# Patient Record
Sex: Female | Born: 1990 | Race: Black or African American | Hispanic: No | Marital: Single | Smoking: Never smoker
Health system: Southern US, Community
[De-identification: ages and names within clinical notes are randomized; demographics above are authoritative.]

## PROBLEM LIST (undated history)

## (undated) DIAGNOSIS — R000 Tachycardia, unspecified: Secondary | ICD-10-CM

## (undated) DIAGNOSIS — R0981 Nasal congestion: Secondary | ICD-10-CM

## (undated) DIAGNOSIS — J329 Chronic sinusitis, unspecified: Secondary | ICD-10-CM

## (undated) DIAGNOSIS — J302 Other seasonal allergic rhinitis: Secondary | ICD-10-CM

## (undated) HISTORY — DX: Tachycardia, unspecified: R00.0

## (undated) HISTORY — DX: Nasal congestion: R09.81

## (undated) HISTORY — DX: Other seasonal allergic rhinitis: J30.2

## (undated) HISTORY — PX: NO PAST SURGERIES: SHX2092

---

## 2011-04-13 ENCOUNTER — Emergency Department (HOSPITAL_COMMUNITY)
Admission: EM | Admit: 2011-04-13 | Discharge: 2011-04-13 | Disposition: A | Discharge status: Home / Self Care | Payer: Self-pay | Attending: Emergency Medicine | Admitting: Emergency Medicine

## 2011-04-13 ENCOUNTER — Emergency Department (HOSPITAL_COMMUNITY): Payer: Self-pay

## 2011-04-13 ENCOUNTER — Encounter (HOSPITAL_COMMUNITY): Payer: Self-pay | Admitting: Emergency Medicine

## 2011-04-13 DIAGNOSIS — R05 Cough: Secondary | ICD-10-CM | POA: Insufficient documentation

## 2011-04-13 DIAGNOSIS — R059 Cough, unspecified: Secondary | ICD-10-CM | POA: Insufficient documentation

## 2011-04-13 DIAGNOSIS — J9801 Acute bronchospasm: Secondary | ICD-10-CM | POA: Insufficient documentation

## 2011-04-13 DIAGNOSIS — J3489 Other specified disorders of nose and nasal sinuses: Secondary | ICD-10-CM | POA: Insufficient documentation

## 2011-04-13 MED ORDER — ALBUTEROL SULFATE (5 MG/ML) 0.5% IN NEBU
5.0000 mg | INHALATION_SOLUTION | Freq: Once | RESPIRATORY_TRACT | Status: AC
  Administered 2011-04-13: 5 mg via RESPIRATORY_TRACT
  Filled 2011-04-13: qty 1

## 2011-04-13 MED ORDER — ALBUTEROL SULFATE HFA 108 (90 BASE) MCG/ACT IN AERS: 2.0000 | INHALATION_SPRAY | RESPIRATORY_TRACT | Status: DC | PRN

## 2011-04-13 MED ORDER — IPRATROPIUM BROMIDE 0.02 % IN SOLN
0.5000 mg | Freq: Once | RESPIRATORY_TRACT | Status: AC
  Administered 2011-04-13: 0.5 mg via RESPIRATORY_TRACT
  Filled 2011-04-13: qty 2.5

## 2011-04-13 MED ORDER — PREDNISONE 20 MG PO TABS
60.0000 mg | ORAL_TABLET | Freq: Once | ORAL | Status: AC
  Administered 2011-04-13: 60 mg via ORAL
  Filled 2011-04-13: qty 3

## 2011-04-13 MED ORDER — GUAIFENESIN ER 1200 MG PO TB12: 1.0000 | ORAL_TABLET | Freq: Two times a day (BID) | ORAL | Status: DC

## 2011-04-13 MED ORDER — PREDNISONE 50 MG PO TABS: 50.0000 mg | ORAL_TABLET | Freq: Every day | ORAL | Status: AC

## 2011-04-13 NOTE — ED Notes (Signed)
Pt. With nasal congestion, cough, and wheezing.  No prior history of asthma.  Not taking any OTC meds.

## 2011-04-13 NOTE — ED Provider Notes (Signed)
History     CSN: [id number]  Arrival date & time 04/13/11  1751   First MD Initiated Contact with Patient 04/13/11 1801      Chief Complaint  Patient presents with  . Nasal Congestion    (Consider location/radiation/quality/duration/timing/severity/associated sxs/prior treatment) HPI Patient presents to the emergency department today with cough.  She states has gotten worse and she went to a baseball game outside today.  She says she does not have a history of asthma, but does have some mild seasonal  allergies.  Patient denies fever, nausea, vomiting, abdominal pain, chest pain, shortness of breath, or back pain.  She does have some nasal congestion with cough and some wheezing. History reviewed. No pertinent past medical history.  History reviewed. No pertinent past surgical history.  No family history on file.  History  Substance Use Topics  . Smoking status: Not on file  . Smokeless tobacco: Not on file  . Alcohol Use: No    OB History    Grav Para Term Preterm Abortions TAB SAB Ect Mult Living                  Review of Systems All pertinent positives and negatives reviewed in the history of present illness  Allergies  Penicillins  Home Medications   Current Outpatient Rx  Name Route Sig Dispense Refill  . BIOTIN 5000 MCG PO CAPS Oral Take 1 capsule by mouth daily.    Marland Kitchen PSEUDOEPHEDRINE-DM-GG-APAP [id number] MG PO PACK Oral Take 1 packet by mouth every 6 (six) hours as needed. Cold    . ZINC SULFATE 220 MG PO CAPS Oral Take 220 mg by mouth daily.      BP 129/71  Pulse 97  Temp(Src) 97.7 F (36.5 C) (Oral)  Resp 20  Ht 5\' 2"  (1.575 m)  Wt 150 lb (68.04 kg)  BMI 27.44 kg/m2  SpO2 100%  LMP 03/30/2011  Physical Exam  Constitutional: She is oriented to person, place, and time. She appears well-developed and well-nourished. No distress.  HENT:  Head: Normocephalic and atraumatic.  Mouth/Throat: Oropharynx is clear and moist. No oropharyngeal  exudate.  Eyes: Conjunctivae are normal. Pupils are equal, round, and reactive to light.  Neck: Normal range of motion. Neck supple.  Cardiovascular: Normal rate, regular rhythm and normal heart sounds.  Exam reveals no gallop and no friction rub.   No murmur heard. Pulmonary/Chest: Effort normal and breath sounds normal. No respiratory distress. She has no wheezes. She has no rales.  Neurological: She is alert and oriented to person, place, and time.  Skin: Skin is warm and dry. No rash noted.    ED Course  Procedures (including critical care time)   Patient is given a nebulized breathing treatment here in the emergency department along with some steroid.  She states feeling better following this treatment.  This is most likely suffering from bronchospasm from environmental allergens.  She will be referred back to her primary care doctor as needed.  Told to return here for any worsening in her condition. MDM  MDM Reviewed: nursing note and vitals Interpretation: x-ray           Carlyle Dolly, PA-C 04/13/11 2018

## 2011-04-13 NOTE — Discharge Instructions (Signed)
And return here as needed for any worsening in her condition.  Increase her fluid intake.  Your x-rays did not show any signs of pneumonia.  It did show that she has some inflammation in her airways, potentially.

## 2011-04-13 NOTE — ED Provider Notes (Signed)
Medical screening examination/treatment/procedure(s) were performed by non-physician practitioner and as supervising physician I was immediately available for consultation/collaboration.  Doug Sou, MD 04/13/11 2317

## 2014-06-03 ENCOUNTER — Encounter (HOSPITAL_COMMUNITY): Payer: Self-pay | Admitting: Emergency Medicine

## 2014-06-03 ENCOUNTER — Emergency Department (HOSPITAL_COMMUNITY)
Admission: EM | Admit: 2014-06-03 | Discharge: 2014-06-03 | Disposition: A | Discharge status: Home / Self Care | Payer: BLUE CROSS/BLUE SHIELD | Attending: Emergency Medicine | Admitting: Emergency Medicine

## 2014-06-03 DIAGNOSIS — B349 Viral infection, unspecified: Secondary | ICD-10-CM | POA: Insufficient documentation

## 2014-06-03 DIAGNOSIS — R51 Headache: Secondary | ICD-10-CM | POA: Diagnosis present

## 2014-06-03 DIAGNOSIS — R002 Palpitations: Secondary | ICD-10-CM | POA: Insufficient documentation

## 2014-06-03 DIAGNOSIS — Z79899 Other long term (current) drug therapy: Secondary | ICD-10-CM | POA: Insufficient documentation

## 2014-06-03 DIAGNOSIS — M791 Myalgia: Secondary | ICD-10-CM | POA: Insufficient documentation

## 2014-06-03 DIAGNOSIS — Z880 Allergy status to penicillin: Secondary | ICD-10-CM | POA: Insufficient documentation

## 2014-06-03 DIAGNOSIS — R630 Anorexia: Secondary | ICD-10-CM | POA: Diagnosis not present

## 2014-06-03 DIAGNOSIS — R000 Tachycardia, unspecified: Secondary | ICD-10-CM | POA: Diagnosis not present

## 2014-06-03 DIAGNOSIS — R42 Dizziness and giddiness: Secondary | ICD-10-CM | POA: Diagnosis not present

## 2014-06-03 HISTORY — DX: Chronic sinusitis, unspecified: J32.9

## 2014-06-03 LAB — CBC WITH DIFFERENTIAL/PLATELET
BASOS ABS: 0 10*3/uL (ref 0.0–0.1)
BASOS PCT: 0 % (ref 0–1)
EOS ABS: 0.5 10*3/uL (ref 0.0–0.7)
EOS PCT: 6 % — AB (ref 0–5)
HEMATOCRIT: 36.3 % (ref 36.0–46.0)
Hemoglobin: 11.6 g/dL — ABNORMAL LOW (ref 12.0–15.0)
LYMPHS ABS: 1.6 10*3/uL (ref 0.7–4.0)
LYMPHS PCT: 18 % (ref 12–46)
MCH: 28.3 pg (ref 26.0–34.0)
MCHC: 32 g/dL (ref 30.0–36.0)
MCV: 88.5 fL (ref 78.0–100.0)
MONOS PCT: 7 % (ref 3–12)
Monocytes Absolute: 0.7 10*3/uL (ref 0.1–1.0)
Neutro Abs: 6 10*3/uL (ref 1.7–7.7)
Neutrophils Relative %: 69 % (ref 43–77)
Platelets: 273 10*3/uL (ref 150–400)
RBC: 4.1 MIL/uL (ref 3.87–5.11)
RDW: 13.6 % (ref 11.5–15.5)
WBC: 8.8 10*3/uL (ref 4.0–10.5)

## 2014-06-03 LAB — COMPREHENSIVE METABOLIC PANEL
ALBUMIN: 4.3 g/dL (ref 3.5–5.0)
ALT: 26 U/L (ref 14–54)
ANION GAP: 9 (ref 5–15)
AST: 24 U/L (ref 15–41)
Alkaline Phosphatase: 85 U/L (ref 38–126)
BUN: 9 mg/dL (ref 6–20)
CHLORIDE: 104 mmol/L (ref 101–111)
CO2: 22 mmol/L (ref 22–32)
CREATININE: 0.81 mg/dL (ref 0.44–1.00)
Calcium: 9.8 mg/dL (ref 8.9–10.3)
GFR calc Af Amer: >60 mL/min (ref >60)
GLUCOSE: 104 mg/dL — AB (ref 65–99)
POTASSIUM: 3.7 mmol/L (ref 3.5–5.1)
SODIUM: 135 mmol/L (ref 135–145)
TOTAL PROTEIN: 7.7 g/dL (ref 6.5–8.1)
Total Bilirubin: 0.7 mg/dL (ref 0.3–1.2)

## 2014-06-03 LAB — URINALYSIS, ROUTINE W REFLEX MICROSCOPIC
Bilirubin Urine: NEGATIVE
GLUCOSE, UA: NEGATIVE mg/dL
HGB URINE DIPSTICK: NEGATIVE
Ketones, ur: NEGATIVE mg/dL
LEUKOCYTES UA: NEGATIVE
NITRITE: NEGATIVE
PH: 5.5 (ref 5.0–8.0)
Protein, ur: NEGATIVE mg/dL
Specific Gravity, Urine: 1.02 (ref 1.005–1.030)
UROBILINOGEN UA: 1 mg/dL (ref 0.0–1.0)

## 2014-06-03 LAB — RAPID STREP SCREEN (MED CTR MEBANE ONLY): Streptococcus, Group A Screen (Direct): NEGATIVE

## 2014-06-03 LAB — I-STAT CG4 LACTIC ACID, ED: LACTIC ACID, VENOUS: 1.07 mmol/L (ref 0.5–2.0)

## 2014-06-03 NOTE — ED Notes (Signed)
C/o generalized body aches, fever, chills, nasal congestion, and headache since Friday morning.  Last took Tylenol 1000mg  at 1am.

## 2014-06-03 NOTE — ED Notes (Addendum)
Pt refusing to have IV started, waiting for MD to assess her. HR on the 140's Nanavati to be notified.

## 2014-06-03 NOTE — Discharge Instructions (Signed)
We saw you in the ER for the malaise, weakness, aches, upper respiratory infection like symptoms. We think what you have is a viral syndrome - the treatment for which is symptomatic relief only, and your body will fight the infection off in a few days.  See your primary care doctor in 1 week if the symptoms dont improve.  AS DISCUSSED - your heart rate is fast, you have some right sided strain evidence and we discussed pulmonary embolism as something to think about. You have opted to have your doctor look into that and return to the ER if your symptoms get worse - please read the information on PE below.   Viral Infections A viral infection can be caused by different types of viruses.Most viral infections are not serious and resolve on their own. However, some infections may cause severe symptoms and may lead to further complications. SYMPTOMS Viruses can frequently cause:  Minor sore throat.  Aches and pains.  Headaches.  Runny nose.  Different types of rashes.  Watery eyes.  Tiredness.  Cough.  Loss of appetite.  Gastrointestinal infections, resulting in nausea, vomiting, and diarrhea. These symptoms do not respond to antibiotics because the infection is not caused by bacteria. However, you might catch a bacterial infection following the viral infection. This is sometimes called a "superinfection." Symptoms of such a bacterial infection may include:  Worsening sore throat with pus and difficulty swallowing.  Swollen neck glands.  Chills and a high or persistent fever.  Severe headache.  Tenderness over the sinuses.  Persistent overall ill feeling (malaise), muscle aches, and tiredness (fatigue).  Persistent cough.  Yellow, green, or brown mucus production with coughing. HOME CARE INSTRUCTIONS   Only take over-the-counter or prescription medicines for pain, discomfort, diarrhea, or fever as directed by your caregiver.  Drink enough water and fluids to keep your  urine clear or pale yellow. Sports drinks can provide valuable electrolytes, sugars, and hydration.  Get plenty of rest and maintain proper nutrition. Soups and broths with crackers or rice are fine. SEEK IMMEDIATE MEDICAL CARE IF:   You have severe headaches, shortness of breath, chest pain, neck pain, or an unusual rash.  You have uncontrolled vomiting, diarrhea, or you are unable to keep down fluids.  You or your child has an oral temperature above 102 F (38.9 C), not controlled by medicine.  Your baby is older than 3 months with a rectal temperature of 102 F (38.9 C) or higher.  Your baby is 55 months old or younger with a rectal temperature of 100.4 F (38 C) or higher. MAKE SURE YOU:   Understand these instructions.  Will watch your condition.  Will get help right away if you are not doing well or get worse. Document Released: 10/02/2004 Document Revised: 03/17/2011 Document Reviewed: 04/29/2010 [facility] Patient Information 2015 [place], [place]. This information is not intended to replace advice given to you by your health care provider. Make sure you discuss any questions you have with your health care provider. Pulmonary Embolism A pulmonary (lung) embolism (PE) is a blood clot that has traveled to the lung and results in a blockage of blood flow in the affected lung. Most clots come from deep veins in the legs or pelvis. PE is a dangerous and potentially life-threatening condition that can be treated if identified. CAUSES Blood clots form in a vein for different reasons. Usually several things cause blood clots. They include:  The flow of blood slows down.  The inside of the  vein is damaged in some way.  The person has a condition that makes the blood clot more easily. RISK FACTORS Some people are more likely than others to develop PE. Risk factors include:   Smoking.  Being overweight (obese).  Sitting or lying still for a long time. This includes long-distance  travel, paralysis, or recovery from an illness or surgery. Other factors that increase risk are:   Older age, especially over 23 years of age.  Having a family history of blood clots or if you have already had a blood clot.  Having major or lengthy surgery. This is especially true for surgery on the hip, knee, or belly (abdomen). Hip surgery is particularly high risk.  Having a long, thin tube (catheter) placed inside a vein during a medical procedure.  Breaking a hip or leg.  Having cancer or cancer treatment.  Medicines containing the female hormone estrogen. This includes birth control pills and hormone replacement therapy.  Other circulation or heart problems.  Pregnancy and childbirth.  Hormone changes make the blood clot more easily during pregnancy.  The fetus puts pressure on the veins of the pelvis.  There is a risk of injury to veins during delivery or a caesarean delivery. The risk is highest just after childbirth.  PREVENTION   Exercise the legs regularly. Take a brisk 30 minute walk every day.  Maintain a weight that is appropriate for your height.  Avoid sitting or lying in bed for long periods of time without moving your legs.  Women, particularly those over the age of 35 years, should consider the risks and benefits of taking estrogen medicines, including birth control pills.  Do not smoke, especially if you take estrogen medicines.  Long-distance travel can increase your risk. You should exercise your legs by walking or pumping the muscles every hour.  Many of the risk factors above relate to situations that exist with hospitalization, either for illness, injury, or elective surgery. Prevention may include medical and nonmedical measures.   Your health care provider will assess you for the need for venous thromboembolism prevention when you are admitted to the hospital. If you are having surgery, your surgeon will assess you the day of or day after  surgery.  SYMPTOMS  The symptoms of a PE usually start suddenly and include:  Shortness of breath.  Coughing.  Coughing up blood or blood-tinged mucus.  Chest pain. Pain is often worse with deep breaths.  Rapid heartbeat. DIAGNOSIS  If a PE is suspected, your health care provider will take a medical history and perform a physical exam. Other tests that may be required include:  Blood tests, such as studies of the clotting properties of your blood.  Imaging tests, such as ultrasound, CT, MRI, and other tests to see if you have clots in your legs or lungs.  An electrocardiogram. This can look for heart strain from blood clots in the lungs. TREATMENT   The most common treatment for a PE is blood thinning (anticoagulant) medicine, which reduces the blood's tendency to clot. Anticoagulants can stop new blood clots from forming and old clots from growing. They cannot dissolve existing clots. Your body does this by itself over time. Anticoagulants can be given by mouth, through an intravenous (IV) tube, or by injection. Your health care provider will determine the best program for you.  Less commonly, clot-dissolving medicines (thrombolytics) are used to dissolve a PE. They carry a high risk of bleeding, so they are used mainly in severe cases.  Very rarely, a blood clot in the leg needs to be removed surgically.  If you are unable to take anticoagulants, your health care provider may arrange for you to have a filter placed in a main vein in your abdomen. This filter prevents clots from traveling to your lungs. HOME CARE INSTRUCTIONS   Take all medicines as directed by your health care provider.  Learn as much as you can about DVT.  Wear a medical alert bracelet or carry a medical alert card.  Ask your health care provider how soon you can go back to normal activities. It is important to stay active to prevent blood clots. If you are on anticoagulant medicine, avoid contact  sports.  It is very important to exercise. This is especially important while traveling, sitting, or standing for long periods of time. Exercise your legs by walking or by tightening and relaxing your leg muscles regularly. Take frequent walks.  You may need to wear compression stockings. These are tight elastic stockings that apply pressure to the lower legs. This pressure can help keep the blood in the legs from clotting. Taking Warfarin Warfarin is a daily medicine that is taken by mouth. Your health care provider will advise you on the length of treatment (usually 3-6 months, sometimes lifelong). If you take warfarin:  Understand how to take warfarin and foods that can affect how warfarin works in Public relations account executive.  Too much and too little warfarin are both dangerous. Too much warfarin increases the risk of bleeding. Too little warfarin continues to allow the risk for blood clots. Warfarin and Regular Blood Testing While taking warfarin, you will need to have regular blood tests to measure your blood clotting time. These blood tests usually include both the prothrombin time (PT) and international normalized ratio (INR) tests. The PT and INR results allow your health care provider to adjust your dose of warfarin. It is very important that you have your PT and INR tested as often as directed by your health care provider.  Warfarin and Your Diet Avoid major changes in your diet, or notify your health care provider before changing your diet. Arrange a visit with a registered dietitian to answer your questions. Many foods, especially foods high in vitamin K, can interfere with warfarin and affect the PT and INR results. You should eat a consistent amount of foods high in vitamin K. Foods high in vitamin K include:   Spinach, kale, broccoli, cabbage, collard and turnip greens, Brussels sprouts, peas, cauliflower, seaweed, and parsley.  Beef and pork liver.  Green tea.  Soybean oil. Warfarin with Other  Medicines Many medicines can interfere with warfarin and affect the PT and INR results. You must:  Tell your health care provider about any and all medicines, vitamins, and supplements you take, including aspirin and other over-the-counter anti-inflammatory medicines. Be especially cautious with aspirin and anti-inflammatory medicines. Ask your health care provider before taking these.  Do not take or discontinue any prescribed or over-the-counter medicine except on the advice of your health care provider or pharmacist. Warfarin Side Effects Warfarin can have side effects, such as easy bruising and difficulty stopping bleeding. Ask your health care provider or pharmacist about other side effects of warfarin. You will need to:  Hold pressure over cuts for longer than usual.  Notify your dentist and other health care providers that you are taking warfarin before you undergo any procedures where bleeding may occur. Warfarin with Alcohol and Tobacco   Drinking alcohol frequently can increase  the effect of warfarin, leading to excess bleeding. It is best to avoid alcoholic drinks or consume only very small amounts while taking warfarin. Notify your health care provider if you change your alcohol intake.  Do not use any tobacco products including cigarettes, chewing tobacco, or electronic cigarettes. If you smoke, quit. Ask your health care provider for help with quitting smoking. Alternative Medicines to Warfarin: Factor Xa Inhibitor Medicines  These blood thinning medicines are taken by mouth, usually for several weeks or longer. It is important to take the medicine every single day, at the same time each day.  There are no regular blood tests required when using these medicines.  There are fewer food and drug interactions than with warfarin.  The side effects of this class of medicine is similar to that of warfarin, including excessive bruising or bleeding. Ask your health care provider or  pharmacist about other potential side effects. SEEK MEDICAL CARE IF:   You notice a rapid heartbeat.  You feel weaker or more tired than usual.  You feel faint.  You notice increased bruising.  Your symptoms are not getting better in the time expected.  You are having side effects of medicine. SEEK IMMEDIATE MEDICAL CARE IF:   You have chest pain.  You have trouble breathing.  You have new or increased swelling or pain in one leg.  You cough up blood.  You notice blood in vomit, in a bowel movement, or in urine.  You have a fever. Symptoms of PE may represent a serious problem that is an emergency. Do not wait to see if the symptoms will go away. Get medical help right away. Call your local emergency services (911 in the [place]). Do not drive yourself to the hospital. Document Released: 12/21/1999 Document Revised: 05/09/2013 Document Reviewed: 01/03/2013 [facility] Patient Information 2015 [place], [place]. This information is not intended to replace advice given to you by your health care provider. Make sure you discuss any questions you have with your health care provider.

## 2014-06-03 NOTE — ED Provider Notes (Signed)
CSN: [id number]     Arrival date & time 06/03/14  0148 History  This chart was scribed for Derwood KaplanAnkit Crystallynn Noorani, MD by Freida Busmaniana Omoyeni, ED Scribe. This patient was seen in room D35C/D35C and the patient's care was started 2:53 AM.     Chief Complaint  Patient presents with  . Headache  . Generalized Body Aches    The history is provided by the patient. No language interpreter was used.    HPI Comments:  Sherri Hodges is a 24 y.o. female who presents to the Emergency Department complaining of generalized body aches and fever for 1 day. She reports associated dizziness, rhinorrhea, sore throat, decreased PO intake, and tachycardia with a HR of 120 bpm,which she notes is normal for her.  She has taken tylenol for her fever; her last dose was ~0100 this am. Pt reports a h/o sinus infection; notes symptoms today are dissimilar.She denies diarrhea, CP,  SOB,cough and rash. Pt takes minastrin birth control pills, which she has been taking for about 3 years. 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  She also denies h/o blood clots, recent surgery/long travel and  family h/o blood clots     Past Medical History  Diagnosis Date  . Sinus infection    History reviewed. No pertinent past surgical history. No family history on file. History  Substance Use Topics  . Smoking status: Never Smoker   . Smokeless tobacco: Not on file  . Alcohol Use: Yes   OB History    No data available     Review of Systems  Constitutional: Positive for fever and appetite change.  HENT: Positive for rhinorrhea and sore throat.   Respiratory: Negative for cough and shortness of breath.   Cardiovascular: Positive for palpitations. Negative for chest pain.  Musculoskeletal: Positive for myalgias (Generalized ).  Skin: Negative for rash.  Neurological: Positive for dizziness.  All other systems reviewed and are  negative.     Allergies  Penicillins  Home Medications   Prior to Admission medications   Medication Sig Start Date End Date Taking? Authorizing Provider  albuterol (PROVENTIL HFA;VENTOLIN HFA) 108 (90 BASE) MCG/ACT inhaler Inhale 2 puffs into the lungs every 4 (four) hours as needed for wheezing. 04/13/11 06/03/14 Yes Christopher Lawyer, PA-C  montelukast (SINGULAIR) 10 MG tablet Take 10 mg by mouth daily as needed (allergies).   Yes Historical Provider, MD  Norethin Ace-Eth Estrad-FE 1-20 MG-MCG(24) CHEW Chew 1 tablet by mouth daily.   Yes Historical Provider, MD   BP 116/61 mmHg  Pulse 99  Temp(Src) 99.1 F (37.3 C) (Oral)  Resp 17  SpO2 100%  LMP 06/01/2014 Physical Exam  Constitutional: She is oriented to person, place, and time. She appears well-developed and well-nourished. No distress.  HENT:  Head: Normocephalic and atraumatic.  No tonsillar enlargement, exudates or erythema Mild submandibular cervical lymphadenopathy  Eyes: Conjunctivae are normal.  Neck:  Thyroid gland appears to be normal with no abnormal nodules   Cardiovascular: Regular rhythm.  Tachycardia present.   No murmur heard. Pulmonary/Chest: Effort normal and breath sounds normal. No respiratory distress.  Abdominal: She exhibits no distension.  Musculoskeletal: She exhibits no edema.  No BLE Pitting edema  Neurological: She is alert and oriented to person, place, and time.  Skin: Skin is warm and dry.  Psychiatric: She has a normal mood and affect.  Nursing note and vitals reviewed.  ED Course  Procedures  DIAGNOSTIC STUDIES:  Oxygen Saturation is 97% on RA, normal by my interpretation.    COORDINATION OF CARE:  3:01 AM Will order d-dimer to r/o blood clots. Advised pt on return precautions. Discussed treatment plan with pt at bedside and pt agreed to plan.  Labs Review Labs Reviewed  CBC WITH DIFFERENTIAL/PLATELET - Abnormal; Notable for the following:    Hemoglobin 11.6 (*)     Eosinophils Relative 6 (*)    All other components within normal limits  COMPREHENSIVE METABOLIC PANEL - Abnormal; Notable for the following:    Glucose, Bld 104 (*)    All other components within normal limits  RAPID STREP SCREEN (NOT AT [facility])  CULTURE, GROUP A STREP  URINALYSIS, ROUTINE W REFLEX MICROSCOPIC (NOT AT [facility])  I-STAT CG4 LACTIC ACID, ED  POC URINE PREG, ED    Imaging Review No results found.   EKG Interpretation   Date/Time:  Saturday Jun 03 2014 01:58:16 EDT Ventricular Rate:  141 PR Interval:  130 QRS Duration: 80 QT Interval:  278 QTC Calculation: 425 R Axis:   90 Text Interpretation:  Sinus tachycardia Rightward axis Borderline ECG  s1q3t3 pattern seen No old tracing to compare Confirmed by Rhunette Croft, MD,  Janey Genta [id number]) on 06/03/2014 3:04:37 AM      MDM   Final diagnoses:  Viral syndrome  Tachycardia    I personally performed the services described in this documentation, which was scribed in my presence. The recorded information has been reviewed and is accurate.   Pt comes in with flu like symptoms. She has tachycardia, fevers. She refuses iv hydration and wants to get oral hydration instead -which i am ok with given normal lactate and labs.  Pt's HR as high as 140, she has s1q3t3 pattern and is on birth control. Discussed TSH and Dimer workup - but pt states that her HR is always this high. Explained to her the PE risk given the birth control use. 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 I have no goiter on exam - advised her to see her pcp in 1 week - especially given the PE and thyroid concerns - and she is ok with that. Strict return precautions for PE like sx discussed.  Derwood Kaplan, MD 06/03/14 [phone]

## 2014-06-06 LAB — CULTURE, GROUP A STREP: Strep A Culture: NEGATIVE

## 2015-03-26 ENCOUNTER — Telehealth: Payer: Self-pay | Admitting: Cardiovascular Disease

## 2015-03-26 NOTE — Telephone Encounter (Signed)
Received records from [facility] for appointment on 04/09/15 with Dr Duke Salviaandolph.  [facility] Dr Leonides Sakeandolph's schedule on 04/09/15. lp

## 2015-04-09 ENCOUNTER — Ambulatory Visit: Payer: BLUE CROSS/BLUE SHIELD | Admitting: Cardiovascular Disease

## 2015-05-31 ENCOUNTER — Encounter: Payer: Self-pay | Admitting: Cardiovascular Disease

## 2015-05-31 ENCOUNTER — Ambulatory Visit (INDEPENDENT_AMBULATORY_CARE_PROVIDER_SITE_OTHER): Payer: BC Managed Care – PPO | Admitting: Cardiovascular Disease

## 2015-05-31 VITALS — BP 126/68 | HR 118 | Ht 62.0 in | Wt 159.0 lb

## 2015-05-31 DIAGNOSIS — R000 Tachycardia, unspecified: Secondary | ICD-10-CM

## 2015-05-31 DIAGNOSIS — R0981 Nasal congestion: Secondary | ICD-10-CM | POA: Insufficient documentation

## 2015-05-31 DIAGNOSIS — R0602 Shortness of breath: Secondary | ICD-10-CM

## 2015-05-31 DIAGNOSIS — J302 Other seasonal allergic rhinitis: Secondary | ICD-10-CM | POA: Insufficient documentation

## 2015-05-31 HISTORY — DX: Tachycardia, unspecified: R00.0

## 2015-05-31 MED ORDER — METOPROLOL SUCCINATE ER 25 MG PO TB24: ORAL_TABLET | ORAL | Status: AC

## 2015-05-31 NOTE — Patient Instructions (Signed)
Medication Instructions:  START METOPROLOL SUCC 25 MG 1/2 TABLET DAILY  Labwork: NONE  Testing/Procedures: Your physician has requested that you have an echocardiogram. Echocardiography is a painless test that uses sound waves to create images of your heart. It provides your doctor with information about the size and shape of your heart and how well your heart's chambers and valves are working. This procedure takes approximately one hour. There are no restrictions for this procedure. [facility] [address]  A chest x-ray takes a picture of the organs and structures inside the chest, including the heart, lungs, and blood vessels. This test can show several things, including, whether the heart is enlarges; whether fluid is building up in the lungs; and whether pacemaker / defibrillator leads are still in place. [facility]  VQ SCAN SOON  Follow-Up: Your physician recommends that you schedule a follow-up appointment in: 1 MONTH OV   If you need a refill on your cardiac medications before your next appointment, please call your pharmacy.  Ventilation-Perfusion Scan A ventilation-perfusion scan is a scan to look at the airflow (ventilation) and blood flow (perfusion) in your lungs. It is most often used to look for blood clots that may have traveled to your lungs. During this scan, radioactive compounds are injected into your body or are breathed in (inhale). These radioactive compounds are detected by a special camera during the scan, are given at very low doses, are not harmful to you, and last in your body for a very short time.  LET [facility] KNOW ABOUT:  Any allergies you have.  All medicines you are taking, including vitamins, herbs, eye drops, creams, and over-the-counter medicines.  Any blood disorders you have.  Previous surgeries you have had.  Medical conditions you have.  Possibility of pregnancy, if  this applies.  Breastfeeding, if this applies. RISKS AND COMPLICATIONS Generally, this is a safe procedure. However, as with any procedure, complications can occur. A possible complication includes having an allergic reaction to the radioactive compounds.  BEFORE THE PROCEDURE  Do not smoke before your test.  Take medicine as directed by your health care provider. PROCEDURE  A small needle will be placed in a vein in your arm or hand. This needle will stay in place for the entire exam.  A small amount of very short-acting radioactive material will be injected.  Your lungs will then be scanned using a special camera. This camera will record the images.  You will be asked to inhale a second radioactive compound. After this, the lungs are scanned again. AFTER THE PROCEDURE  You may go home unless your health care provider instructs you differently.  You may continue with normal activities and diet as instructed by your health care provider.   This information is not intended to replace advice given to you by your health care provider. Make sure you discuss any questions you have with your health care provider.   Document Released: 12/21/1999 Document Revised: 01/13/2014 Document Reviewed: 07/08/2012 Elsevier Interactive Patient Education [employer]2016 Elsevier Inc.

## 2015-05-31 NOTE — Progress Notes (Signed)
Cardiology Office Note   Date:  05/31/2015   ID:  Sherri Hodges, DOB Aug 21, 1990, MRN [id number]  PCP:  No primary care provider on file.  Cardiologist:   Sherri Siiffany Gleason, MD   Chief Complaint  Patient presents with  . New Evaluation    TACHYCARDIA  . Chest Pain     History of Present Illness: Sherri Hodges is a 25 y.o. female who presents for an evaluation of sinus tachycardia. Ms. Sherri Hodges reports elevated resting heart rates for over a year now. 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 The lowest she has seen it is in the upper 80s. She denies palpitations, lightheadedness, or dizziness. However she does no exercise intolerance and has noticed that her heart rate increases significantly when she exercises. If his of this she has not been exercising recently. She notes occasional episodes of chest pain that lasts for 30 seconds at a time. There is no associated lightheadedness or dizziness. It does not occur with exertion. She saw her PCP who checks labs that were reportedly normal and referred her to cardiology.  Ms. Sherri Hodges drinks 1 caffeinated beverage daily at the most. She has only been doing this for the last couple months, as her shift ends at midnight and she drinks coffee prior to her right home. The tachycardia preceded her caffeine intake. She does not use over-the-counter decongestants or stimulant medications.  She also denies illicit drug use.  She rarely uses her albuterol inhaler. She has been stressed lately, as she is working as a Radiation protection practitionerparamedic, in school for nursing, trying to buy a house. However, her tachycardia preceded these stressors. She does not feel particularly anxious.  Ms. Sherri Hodges has not noted any lower extremity edema, orthopnea or PND.   Past Medical History  Diagnosis Date  . Sinus infection   . Seasonal allergies   . Sinus congestion   . Inappropriate sinus tachycardia (HCC) 05/31/2015    History reviewed. No  pertinent past surgical history.   Current Outpatient Prescriptions  Medication Sig Dispense Refill  . montelukast (SINGULAIR) 10 MG tablet Take 10 mg by mouth daily as needed (allergies).    . Norethin Ace-Eth Estrad-FE 1-20 MG-MCG(24) CHEW Chew 1 tablet by mouth daily.    Marland Kitchen. albuterol (PROVENTIL HFA;VENTOLIN HFA) 108 (90 BASE) MCG/ACT inhaler Inhale 2 puffs into the lungs every 4 (four) hours as needed for wheezing. 3.7 g 0  . metoprolol succinate (TOPROL XL) 25 MG 24 hr tablet 1/2 TABLET BY MOUTH DAILY 15 tablet 5   No current facility-administered medications for this visit.    Allergies:   Penicillins and Sulfa antibiotics    Social History:  The patient  reports that she has never smoked. She does not have any smokeless tobacco history on file. She reports that she drinks alcohol. She reports that she does not use illicit drugs.   Family History:  The patient's family history includes Diabetes in her maternal grandfather; Hypertension in her maternal grandfather and paternal grandmother.    ROS:  Please see the history of present illness.   Otherwise, review of systems are positive for none.   All other systems are reviewed and negative.    PHYSICAL EXAM: VS:  BP 126/68 mmHg  Pulse 118  Ht 5\' 2"  (1.575 m)  Wt 72.122 kg (159 lb)  BMI 29.07 kg/m2 , BMI Body mass index is 29.07 kg/(m^2). GENERAL:  Well appearing HEENT:  Pupils equal  round and reactive, fundi not visualized, oral mucosa unremarkable NECK:  No jugular venous distention, waveform within normal limits, carotid upstroke brisk and symmetric, no bruits, no thyromegaly LYMPHATICS:  No cervical adenopathy LUNGS:  Clear to auscultation bilaterally HEART:  RRR.  PMI not displaced or sustained,S1 and S2 within normal limits, no S3, no S4, no clicks, no rubs, II/VI systolic murmur at the left upper sternal border.  ABD:  Flat, positive bowel sounds normal in frequency in pitch, no bruits, no rebound, no guarding, no midline  pulsatile mass, no hepatomegaly, no splenomegaly EXT:  2 plus pulses throughout, no edema, no cyanosis no clubbing SKIN:  No rashes no nodules NEURO:  Cranial nerves II through XII grossly intact, motor grossly intact throughout PSYCH:  Cognitively intact, oriented to person place and time   EKG:  EKG is ordered today. The ekg ordered today demonstrates sinus tachycardia. Rate 118 bpm.  Recent Labs: 06/03/2014: ALT 26; BUN 9; Creatinine, Ser 0.81; Hemoglobin 11.6*; Platelets 273; Potassium 3.7; Sodium 135    Lipid Panel No results found for: CHOL, TRIG, HDL, CHOLHDL, VLDL, LDLCALC, LDLDIRECT    Wt Readings from Last 3 Encounters:  05/31/15 72.122 kg (159 lb)  04/13/11 68.04 kg (150 lb) (81 %*, Z = 0.86)   * Growth percentiles are based on CDC 2-20 Years data.      ASSESSMENT AND PLAN:  # Sinus tachycardia:  # murmur: [name] resting heart rate has been consistently in the 110s.  There are no clear triggers for her sinus tachycardia. She has no evidence of infection, pain, or medications that should be causing this. She does have albuterol but uses it very rarely. She had labs with her primary care provider that reportedly showed normal thyroid function and blood counts. We will obtain these records from her provider.  We will also obtain a VQ scan to assess for chronic pulmonary embolism. Additionally, we will obtain an echo to evaluate her murmur and to ensure that she does not have any heart failure from prolonged tachycardia. We will try metoprolol succinate 12.5 mg daily. It is quite possible that this will cause dizziness as her blood pressure is normal at this time. His occurs, we will consider ivabradine.  # Atypical chest pain: Not consistent with ischemia. We will look for her coronary artery origins on echo.   Current medicines are reviewed at length with the patient today.  The patient does not have concerns regarding medicines.  The following changes have been made:   Start metoprolol succinate 12.5 mg daily  Labs/ tests ordered today include:   Orders Placed This Encounter  Procedures  . NM Pulmonary Perf and Vent  . DG Chest 2 View  . EKG 12-Lead  . ECHOCARDIOGRAM COMPLETE     Disposition:   FU with Keshia Weare C. Duke Salvia, MD, [facility] in 1 month.     This note was written with the assistance of speech recognition software.  Please excuse any transcriptional errors.  Signed, Maudine Kluesner C. Duke Salvia, MD, [facility]  05/31/2015 9:01 AM    [facility]

## 2015-06-08 ENCOUNTER — Ambulatory Visit (HOSPITAL_COMMUNITY)
Admission: RE | Admit: 2015-06-08 | Discharge: 2015-06-08 | Disposition: A | Discharge status: Home / Self Care | Payer: Managed Care, Other (non HMO) | Source: Ambulatory Visit | Attending: Cardiovascular Disease | Admitting: Cardiovascular Disease

## 2015-06-08 DIAGNOSIS — R0602 Shortness of breath: Secondary | ICD-10-CM

## 2015-06-08 DIAGNOSIS — R000 Tachycardia, unspecified: Secondary | ICD-10-CM | POA: Diagnosis not present

## 2015-06-08 MED ORDER — TECHNETIUM TC 99M DIETHYLENETRIAME-PENTAACETIC ACID: 30.2000 | Freq: Once | INTRAVENOUS | Status: DC | PRN

## 2015-06-08 MED ORDER — TECHNETIUM TO 99M ALBUMIN AGGREGATED
4.1000 | Freq: Once | INTRAVENOUS | Status: AC | PRN
  Administered 2015-06-08: 4 via INTRAVENOUS

## 2015-06-11 ENCOUNTER — Telehealth: Payer: Self-pay | Admitting: *Deleted

## 2015-06-11 NOTE — Telephone Encounter (Signed)
Advised patient of results Patient did want to reschedule her Echo. Unable to scheduled before end of June secondary to her being out of town and work schedule

## 2015-06-11 NOTE — Telephone Encounter (Signed)
-----   Message from Chilton Siiffany Kimball, MD sent at 06/10/2015 10:40 PM EDT ----- No PE

## 2015-06-11 NOTE — Telephone Encounter (Signed)
Left message to call back  

## 2015-06-15 ENCOUNTER — Other Ambulatory Visit (HOSPITAL_COMMUNITY): Payer: BC Managed Care – PPO

## 2015-06-22 ENCOUNTER — Encounter: Payer: Self-pay | Admitting: Cardiovascular Disease

## 2015-07-06 ENCOUNTER — Other Ambulatory Visit: Payer: Self-pay

## 2015-07-06 ENCOUNTER — Ambulatory Visit (HOSPITAL_COMMUNITY): Payer: Managed Care, Other (non HMO) | Attending: Cardiology

## 2015-07-06 DIAGNOSIS — R0602 Shortness of breath: Secondary | ICD-10-CM | POA: Diagnosis not present

## 2015-07-06 DIAGNOSIS — R000 Tachycardia, unspecified: Secondary | ICD-10-CM | POA: Diagnosis not present

## 2015-07-06 DIAGNOSIS — I071 Rheumatic tricuspid insufficiency: Secondary | ICD-10-CM | POA: Diagnosis not present

## 2015-07-06 DIAGNOSIS — R011 Cardiac murmur, unspecified: Secondary | ICD-10-CM | POA: Diagnosis present

## 2015-07-06 LAB — ECHOCARDIOGRAM COMPLETE
AOASC: 25 cm
CHL CUP DOP CALC LVOT VTI: 21.1 cm
CHL CUP REG VEL DIAS: 90.8 cm/s
CHL CUP RV SYS PRESS: 26 mmHg
CHL CUP TV REG PEAK VELOCITY: 215 cm/s
EERAT: 6.31
EWDT: 141 ms
FS: 30 % (ref 28–44)
IV/PV OW: 1.45
LA diam end sys: 32 mm
LA vol: 40.1 mL
LADIAMINDEX: 1.78 cm/m2
LASIZE: 32 mm
LAVOLA4C: 43.2 mL
LAVOLIN: 22.3 mL/m2
LV PW d: 6.7 mm — AB (ref 0.6–1.1)
LV TDI E'LATERAL: 16
LVEEAVG: 6.31
LVEEMED: 6.31
LVELAT: 16 cm/s
LVOT area: 3.14 cm2
LVOT peak vel: 99.8 cm/s
LVOTD: 20 mm
LVOTSV: 66 mL
MV Dec: 141
MV pk A vel: 69.8 m/s
MVPG: 4 mmHg
MVPKEVEL: 101 m/s
RV LATERAL S' VELOCITY: 12.4 cm/s
RV TAPSE: 23.2 mm
TDI e' medial: 10.2
TR max vel: 215 cm/s

## 2015-07-23 NOTE — Progress Notes (Signed)
Cardiology Office Note   Date:  07/24/2015   ID:  Sherri PatSade Crumrine, DOB 02/11/1990, MRN [id number]  PCP:  REDMON,NOELLE, PA-C  Cardiologist:   Chilton Siiffany [place], MD   Chief Complaint  Patient presents with  . Follow-up     History of Present Illness: Sherri Hodges is a 25 y.o. female who presents for follow up on inappropriate sinus tachycardia.  She was evaluated for resting tachycardia 05/2015.   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  She was referred for a V/Q scan that was negative for PE.  Basic laboratory testing was unremarkable and she did not have a high caffeine intake.  She also was noted to have a murmur and an echo that revealed LVEF 55-60% and mild TR.  Additionally, she was started on metoprolol.  Since starting metoprolol she is feeling much better. She notes that her heart rate is better at rest and that it does not increase with exercise.  She denies palpitations, lightheadedness, or dizziness.  She also denies lower extremity edema, orthopnea, or PND. Her only complaint is congestion from allergies.     Past Medical History  Diagnosis Date  . Sinus infection   . Seasonal allergies   . Sinus congestion   . Inappropriate sinus tachycardia (HCC) 05/31/2015    Past Surgical History  Procedure Laterality Date  . No past surgeries       Current Outpatient Prescriptions  Medication Sig Dispense Refill  . MedroxyPROGESTERone Acetate 150 MG/ML SUSY Inject 150 mg as directed every 3 (three) months.  12  . metoprolol succinate (TOPROL XL) 25 MG 24 hr tablet 1/2 TABLET BY MOUTH DAILY 15 tablet 5  . montelukast (SINGULAIR) 10 MG tablet Take 10 mg by mouth daily as needed (allergies).    . Norethin Ace-Eth Estrad-FE 1-20 MG-MCG(24) CHEW Chew 1 tablet by mouth daily.     No current facility-administered medications for this visit.    Allergies:   Penicillins and Sulfa antibiotics    Social History:  The patient  reports that she has never  smoked. She does not have any smokeless tobacco history on file. She reports that she drinks alcohol. She reports that she does not use illicit drugs.   Family History:  The patient's family history includes Diabetes in her maternal grandfather; Hypertension in her maternal grandfather and paternal grandmother; Pancreatic cancer in her paternal grandfather.    ROS:  Please see the history of present illness.   Otherwise, review of systems are positive for none.   All other systems are reviewed and negative.    PHYSICAL EXAM: VS:  BP 116/84 mmHg  Pulse 83  Ht 5\' 2"  (1.575 m)  Wt 161 lb 12.8 oz (73.392 kg)  BMI 29.59 kg/m2 , BMI Body mass index is 29.59 kg/(m^2). GENERAL:  Well appearing HEENT:  Pupils equal round and reactive, fundi not visualized, oral mucosa unremarkable NECK:  No jugular venous distention, waveform within normal limits, carotid upstroke brisk and symmetric, no bruits, no thyromegaly LYMPHATICS:  No cervical adenopathy LUNGS:  Clear to auscultation bilaterally HEART:  RRR.  PMI not displaced or sustained,S1 and S2 within normal limits, no S3, no S4, no clicks, no rubs, II/VI systolic murmur at the left upper sternal border.  ABD:  Flat, positive bowel sounds normal in frequency in pitch, no bruits, no rebound, no guarding, no midline pulsatile mass, no hepatomegaly, no splenomegaly EXT:  2 plus pulses throughout, no edema,  no cyanosis no clubbing SKIN:  No rashes no nodules NEURO:  Cranial nerves II through XII grossly intact, motor grossly intact throughout PSYCH:  Cognitively intact, oriented to person place and time   EKG:  EKG is ordered today. The ekg ordered today demonstrates sinus trhythm rate 83 bpm.  Echo 07/06/15: Study Conclusions  - Left ventricle: The cavity size was normal. Wall thickness was  normal. Systolic function was normal. The estimated ejection  fraction was in the range of 55% to 60%. Wall motion was normal;  there were no regional  wall motion abnormalities. Left  ventricular diastolic function parameters were normal.  Impressions:  - Normal LV systolic and diastolic function; mild TR.  Recent Labs: No results found for requested labs within last 365 days.    Lipid Panel No results found for: CHOL, TRIG, HDL, CHOLHDL, VLDL, LDLCALC, LDLDIRECT    Wt Readings from Last 3 Encounters:  07/24/15 161 lb 12.8 oz (73.392 kg)  05/31/15 159 lb (72.122 kg)  04/13/11 150 lb (68.04 kg) (81 %*, Z = 0.86)   * Growth percentiles are based on CDC 2-20 Years data.      ASSESSMENT AND PLAN:  # Sinus tachycardia:  # murmur: Ms. Charbonneau is doing well on metoprolol.  Her echo was normal and she did not have PEs on her V/Q scan.  Continue metoprolol  # Atypical chest pain: Not consistent with ischemia.  Symptoms have resolved.  Current medicines are reviewed at length with the patient today.  The patient does not have concerns regarding medicines.  The following changes have been made:  None/ Labs/ tests ordered today include:   No orders of the defined types were placed in this encounter.     Disposition:   FU with Betzalel Umbarger C. Duke Salvia, MD, [facility] as needed.   This note was written with the assistance of speech recognition software.  Please excuse any transcriptional errors.  Signed, Cecile Gillispie C. Duke Salvia, MD, [facility]  07/24/2015 9:24 AM    [facility]

## 2015-07-24 ENCOUNTER — Ambulatory Visit (INDEPENDENT_AMBULATORY_CARE_PROVIDER_SITE_OTHER): Payer: Managed Care, Other (non HMO) | Admitting: Cardiovascular Disease

## 2015-07-24 ENCOUNTER — Encounter: Payer: Self-pay | Admitting: Cardiovascular Disease

## 2015-07-24 VITALS — BP 116/84 | HR 83 | Ht 62.0 in | Wt 161.8 lb

## 2015-07-24 DIAGNOSIS — R000 Tachycardia, unspecified: Secondary | ICD-10-CM

## 2015-07-24 NOTE — Addendum Note (Signed)
Addended by: Regis BillPRATT, Khan Chura B on: 07/24/2015 09:58 AM   Modules accepted: Orders

## 2017-12-20 IMAGING — NM NM PULMONARY VENT & PERF
16 series · 16 of 16 positions shown · non-contrast
Comparison: Chest x-ray 06/08/2015.

CLINICAL DATA: Tachycardia.  Shortness of breath.

EXAM:
NUCLEAR MEDICINE VENTILATION - PERFUSION LUNG SCAN
TECHNIQUE: Ventilation images were obtained in multiple projections using
inhaled aerosol Oc-AAm DTPA. Perfusion images were obtained in
multiple projections after intravenous injection of Oc-AAm MAA.
RADIOPHARMACEUTICALS:  30.2 mCi Aechnetium-99m DTPA aerosol
inhalation and 4.1 mCi Aechnetium-99m MAA IV

[Series 1: ant/post vent · 4.14mm/px · 1 of 1 slices shown (1 of 2)]
[im 1/1]
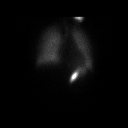

[Series 1: ant/post vent · 4.14mm/px · 1 of 1 slices shown (2 of 2)]
[im 1/1]
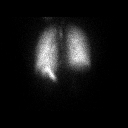

[Series 2: lao/rpo vent · 4.14mm/px · 1 of 1 slices shown (1 of 2)]
[im 1/1]
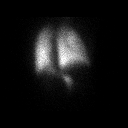

[Series 2: lao/rpo vent · 4.14mm/px · 1 of 1 slices shown (2 of 2)]
[im 1/1]
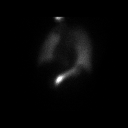

[Series 3: lpo/rao vent · 4.14mm/px · 1 of 1 slices shown (1 of 2)]
[im 1/1]
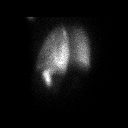

[Series 3: lpo/rao vent · 4.14mm/px · 1 of 1 slices shown (2 of 2)]
[im 1/1]
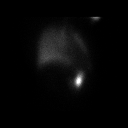

[Series 4: lt lat/rt lat vent · 4.14mm/px · 1 of 1 slices shown (1 of 2)]
[im 1/1]
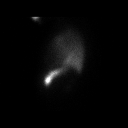

[Series 4: lt lat/rt lat vent · 4.14mm/px · 1 of 1 slices shown (2 of 2)]
[im 1/1]
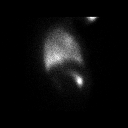

[Series 5: lt lat/rt lat perf · 4.14mm/px · 1 of 1 slices shown (1 of 2)]
[im 1/1]
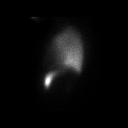

[Series 5: lt lat/rt lat perf · 4.14mm/px · 1 of 1 slices shown (2 of 2)]
[im 1/1]
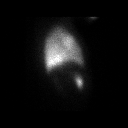

[Series 6: lpo/rao perf · 4.14mm/px · 1 of 1 slices shown (1 of 2)]
[im 1/1]
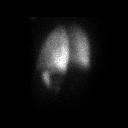

[Series 6: lpo/rao perf · 4.14mm/px · 1 of 1 slices shown (2 of 2)]
[im 1/1]
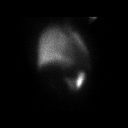

[Series 7: ant/post perf · 4.14mm/px · 1 of 1 slices shown (1 of 2)]
[im 1/1]
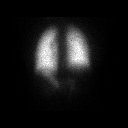

[Series 7: ant/post perf · 4.14mm/px · 1 of 1 slices shown (2 of 2)]
[im 1/1]
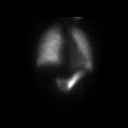

[Series 8: lao/rpo perf · 4.14mm/px · 1 of 1 slices shown (1 of 2)]
[im 1/1]
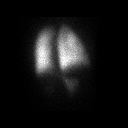

[Series 8: lao/rpo perf · 4.14mm/px · 1 of 1 slices shown (2 of 2)]
[im 1/1]
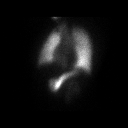

[16 of 16 positions shown; findings below may reference images not displayed]

FINDINGS: Ventilation: No focal ventilation defect.

Perfusion: No wedge shaped peripheral perfusion defects to suggest
acute pulmonary embolism.
IMPRESSION: Normal exam.

## 2018-03-22 ENCOUNTER — Encounter (HOSPITAL_COMMUNITY): Payer: Self-pay | Admitting: Emergency Medicine

## 2018-03-22 ENCOUNTER — Ambulatory Visit (HOSPITAL_COMMUNITY)
Admission: EM | Admit: 2018-03-22 | Discharge: 2018-03-22 | Disposition: A | Discharge status: Home / Self Care | Payer: PRIVATE HEALTH INSURANCE | Attending: Family Medicine | Admitting: Family Medicine

## 2018-03-22 DIAGNOSIS — R21 Rash and other nonspecific skin eruption: Secondary | ICD-10-CM | POA: Diagnosis not present

## 2018-03-22 DIAGNOSIS — W57XXXA Bitten or stung by nonvenomous insect and other nonvenomous arthropods, initial encounter: Secondary | ICD-10-CM

## 2018-03-22 DIAGNOSIS — L298 Other pruritus: Secondary | ICD-10-CM

## 2018-03-22 MED ORDER — TRIAMCINOLONE ACETONIDE 0.1 % EX CREA: 1.0000 "application " | TOPICAL_CREAM | Freq: Two times a day (BID) | CUTANEOUS | 0 refills | Status: AC

## 2018-03-22 NOTE — ED Triage Notes (Signed)
Pt here for bilateral ankle pain and swelling with possible insect bites

## 2018-03-22 NOTE — Discharge Instructions (Signed)
Please apply Kenalog cream twice daily to areas on ankle to help with itching and swelling Please also use antihistamines like daily Zyrtec/Claritin to help with itching, Benadryl at nighttime  If developing worsening redness, swelling and pain around these areas may fill prescription for Keflex previously provided to you  Ice and elevate  Follow-up if not resolving

## 2018-03-24 NOTE — ED Provider Notes (Signed)
[facility]    CSN: [id number] Arrival date & time: 03/22/18  1615     History   Chief Complaint Chief Complaint  Patient presents with  . Ankle Pain    HPI Sherri Hodges is a 28 y.o. female no contributing past medical history presenting today for evaluation of possible bug bites.  Patient states that last night she felt as if something was crawling on her lower legs.  Later that evening she noticed a few bumps to bilateral ankles.  Since this morning she has had increased pain and noticed increased redness around these areas.  She has not applied anything.  She had a tele-visit and was provided with Keflex, but she wanted them looked at in person as well.  Main symptoms associated with these bumps are itching, but does have some mild pain associated as well.  HPI  Past Medical History:  Diagnosis Date  . Inappropriate sinus tachycardia 05/31/2015  . Seasonal allergies   . Sinus congestion   . Sinus infection     Patient Active Problem List   Diagnosis Date Noted  . Inappropriate sinus tachycardia 05/31/2015  . Seasonal allergies   . Sinus congestion     Past Surgical History:  Procedure Laterality Date  . NO PAST SURGERIES      OB History   No obstetric history on file.      Home Medications    Prior to Admission medications   Medication Sig Start Date End Date Taking? Authorizing Provider  MedroxyPROGESTERone Acetate 150 MG/ML SUSY Inject 150 mg as directed every 3 (three) months. 06/20/15   [provider]  metoprolol succinate (TOPROL XL) 25 MG 24 hr tablet 1/2 TABLET BY MOUTH DAILY 05/31/15   Chilton Si, MD  montelukast (SINGULAIR) 10 MG tablet Take 10 mg by mouth daily as needed (allergies).    [provider]  Norethin Ace-Eth Estrad-FE 1-20 MG-MCG(24) CHEW Chew 1 tablet by mouth daily.    [provider]  triamcinolone cream (KENALOG) 0.1 % Apply 1 application topically 2 (two) times daily. 03/22/18   Karilynn Carranza, Junius Creamer, PA-C    Family History Family History  Problem Relation Age of Onset  . Hypertension Paternal Grandmother   . Hypertension Maternal Grandfather   . Diabetes Maternal Grandfather   . Pancreatic cancer Paternal Grandfather     Social History Social History   Tobacco Use  . Smoking status: Never Smoker  Substance Use Topics  . Alcohol use: Yes  . Drug use: No     Allergies   Penicillins and Sulfa antibiotics   Review of Systems Review of Systems  Constitutional: Negative for fatigue and fever.  HENT: Negative for trouble swallowing.   Eyes: Negative for visual disturbance.  Respiratory: Negative for shortness of breath.   Cardiovascular: Negative for chest pain.  Gastrointestinal: Negative for abdominal pain, nausea and vomiting.  Musculoskeletal: Negative for arthralgias and joint swelling.  Skin: Positive for color change and rash. Negative for wound.  Neurological: Negative for dizziness, weakness, light-headedness and headaches.     Physical Exam Triage Vital Signs ED Triage Vitals  Enc Vitals Group     BP 03/22/18 1640 128/85     Pulse Rate 03/22/18 1640 98     Resp 03/22/18 1640 16     Temp 03/22/18 1640 98.5 F (36.9 C)     Temp Source 03/22/18 1640 Oral     SpO2 03/22/18 1640 99 %     Weight --  Height --      Head Circumference --      Peak Flow --      Pain Score 03/22/18 1648 6     Pain Loc --      Pain Edu? --      Excl. in GC? --    No data found.  Updated Vital Signs BP 128/85 (BP Location: Left Arm)   Pulse 98   Temp 98.5 F (36.9 C) (Oral)   Resp 16   SpO2 99%   Visual Acuity Right Eye Distance:   Left Eye Distance:   Bilateral Distance:    Right Eye Near:   Left Eye Near:    Bilateral Near:     Physical Exam Vitals signs and nursing note reviewed.  Constitutional:      Appearance: She is well-developed.     Comments: No acute distress  HENT:     Head: Normocephalic and atraumatic.     Nose: Nose normal.   Eyes:     Conjunctiva/sclera: Conjunctivae normal.  Neck:     Musculoskeletal: Neck supple.  Cardiovascular:     Rate and Rhythm: Normal rate.  Pulmonary:     Effort: Pulmonary effort is normal. No respiratory distress.  Abdominal:     General: There is no distension.  Musculoskeletal: Normal range of motion.     Comments: Dorsalis pedis 2+, full active range of motion of ankles  Skin:    General: Skin is warm and dry.     Comments: Bilateral ankles with multiple erythematous bumps, area on left ankle with increased redness and tenderness to touch.  Others with localized surrounding erythema and swelling, no significant edema extending into shin or dorsum of foot  Neurological:     Mental Status: She is alert and oriented to person, place, and time.      UC Treatments / Results  Labs (all labs ordered are listed, but only abnormal results are displayed) Labs Reviewed - No data to display  EKG None  Radiology No results found.  Procedures Procedures (including critical care time)  Medications Ordered in UC Medications - No data to display  Initial Impression / Assessment and Plan / UC Course  I have reviewed the triage vital signs and the nursing notes.  Pertinent labs & imaging results that were available during my care of the patient were reviewed by me and considered in my medical decision making (see chart for details).     Patient with multiple areas on bilateral ankles that are suggestive of bug bites, erythema and swelling locally more likely local inflammation, one bite on left with increased redness and warmth, will recommend for patient to begin Keflex provided by previous visit, will also provide triamcinolone cream to apply to help with inflammation and itching.  Continue to monitor,Discussed strict return precautions. Patient verbalized understanding and is agreeable with plan.  Final Clinical Impressions(s) / UC Diagnoses   Final diagnoses:  Bug bite,  initial encounter     Discharge Instructions     Please apply Kenalog cream twice daily to areas on ankle to help with itching and swelling Please also use antihistamines like daily Zyrtec/Claritin to help with itching, Benadryl at nighttime  If developing worsening redness, swelling and pain around these areas may fill prescription for Keflex previously provided to you  Ice and elevate  Follow-up if not resolving   ED Prescriptions    Medication Sig Dispense Auth. Provider   triamcinolone cream (KENALOG) 0.1 % Apply 1  application topically 2 (two) times daily. 30 g Sherri Hodges, [place] C, PA-C     Controlled Substance Prescriptions  Controlled Substance Registry consulted? Not Applicable   Lew Dawes, [place] 03/24/18 [phone]

## 2019-04-11 ENCOUNTER — Ambulatory Visit: Payer: PRIVATE HEALTH INSURANCE | Attending: Internal Medicine

## 2019-04-11 DIAGNOSIS — Z20822 Contact with and (suspected) exposure to covid-19: Secondary | ICD-10-CM

## 2019-04-12 LAB — SARS-COV-2, NAA 2 DAY TAT

## 2019-04-12 LAB — NOVEL CORONAVIRUS, NAA: SARS-CoV-2, NAA: NOT DETECTED

## 2019-04-18 ENCOUNTER — Other Ambulatory Visit: Payer: PRIVATE HEALTH INSURANCE
